# Patient Record
Sex: Male | Born: 2000 | Race: White | Hispanic: No | Marital: Single | Smoking: Never smoker
Health system: Southern US, Community
[De-identification: ages and names within clinical notes are randomized; demographics above are authoritative.]

## PROBLEM LIST (undated history)

## (undated) HISTORY — PX: OTHER SURGICAL HISTORY: SHX169

---
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
## 2021-03-04 ENCOUNTER — Encounter (INDEPENDENT_AMBULATORY_CARE_PROVIDER_SITE_OTHER): Payer: Self-pay
